# Patient Record
Sex: Male | Born: 1965 | Hispanic: No
Health system: Southern US, Community
[De-identification: ages and names within clinical notes are randomized; demographics above are authoritative.]

## PROBLEM LIST (undated history)

## (undated) HISTORY — PX: KNEE ARTHROSCOPY: SHX127

## (undated) HISTORY — PX: HERNIA REPAIR: SHX51

---

## 2002-01-29 ENCOUNTER — Ambulatory Visit (HOSPITAL_BASED_OUTPATIENT_CLINIC_OR_DEPARTMENT_OTHER): Admission: RE | Admit: 2002-01-29 | Discharge: 2002-01-29 | Payer: Self-pay | Admitting: Orthopedic Surgery

## 2017-08-17 ENCOUNTER — Emergency Department (HOSPITAL_BASED_OUTPATIENT_CLINIC_OR_DEPARTMENT_OTHER)
Admission: EM | Admit: 2017-08-17 | Discharge: 2017-08-17 | Disposition: A | Discharge status: Home / Self Care | Payer: Managed Care, Other (non HMO) | Attending: Emergency Medicine | Admitting: Emergency Medicine

## 2017-08-17 ENCOUNTER — Encounter (HOSPITAL_BASED_OUTPATIENT_CLINIC_OR_DEPARTMENT_OTHER): Payer: Self-pay | Admitting: *Deleted

## 2017-08-17 ENCOUNTER — Emergency Department (HOSPITAL_BASED_OUTPATIENT_CLINIC_OR_DEPARTMENT_OTHER): Payer: Managed Care, Other (non HMO)

## 2017-08-17 DIAGNOSIS — Y998 Other external cause status: Secondary | ICD-10-CM | POA: Diagnosis not present

## 2017-08-17 DIAGNOSIS — Z96652 Presence of left artificial knee joint: Secondary | ICD-10-CM | POA: Insufficient documentation

## 2017-08-17 DIAGNOSIS — Y9389 Activity, other specified: Secondary | ICD-10-CM | POA: Diagnosis not present

## 2017-08-17 DIAGNOSIS — M25562 Pain in left knee: Secondary | ICD-10-CM | POA: Diagnosis present

## 2017-08-17 DIAGNOSIS — S8002XA Contusion of left knee, initial encounter: Secondary | ICD-10-CM | POA: Diagnosis not present

## 2017-08-17 DIAGNOSIS — Y929 Unspecified place or not applicable: Secondary | ICD-10-CM | POA: Insufficient documentation

## 2017-08-17 DIAGNOSIS — T148XXA Other injury of unspecified body region, initial encounter: Secondary | ICD-10-CM

## 2017-08-17 DIAGNOSIS — Z96651 Presence of right artificial knee joint: Secondary | ICD-10-CM | POA: Insufficient documentation

## 2017-08-17 DIAGNOSIS — M791 Myalgia: Secondary | ICD-10-CM | POA: Insufficient documentation

## 2017-08-17 MED ORDER — IBUPROFEN 600 MG PO TABS: 600.0000 mg | ORAL_TABLET | Freq: Four times a day (QID) | ORAL | 0 refills | Status: AC | PRN

## 2017-08-17 MED ORDER — METHOCARBAMOL 500 MG PO TABS: 500.0000 mg | ORAL_TABLET | Freq: Three times a day (TID) | ORAL | 0 refills | Status: AC | PRN

## 2017-08-17 MED ORDER — METHOCARBAMOL 500 MG PO TABS
1000.0000 mg | ORAL_TABLET | Freq: Once | ORAL | Status: AC
  Administered 2017-08-17: 1000 mg via ORAL
  Filled 2017-08-17: qty 2

## 2017-08-17 MED ORDER — IBUPROFEN 400 MG PO TABS
600.0000 mg | ORAL_TABLET | Freq: Once | ORAL | Status: AC
  Administered 2017-08-17: 600 mg via ORAL
  Filled 2017-08-17: qty 1
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
